# Patient Record
Sex: Female | Born: 1950 | Race: Black or African American | Hispanic: No | Marital: Married | Smoking: Former smoker
Health system: Southern US, Community
[De-identification: ages and names within clinical notes are randomized; demographics above are authoritative.]

## PROBLEM LIST (undated history)

## (undated) ENCOUNTER — Emergency Department (HOSPITAL_BASED_OUTPATIENT_CLINIC_OR_DEPARTMENT_OTHER): Admission: EM | Payer: Medicare Other | Source: Home / Self Care

## (undated) DIAGNOSIS — J449 Chronic obstructive pulmonary disease, unspecified: Secondary | ICD-10-CM

## (undated) DIAGNOSIS — E785 Hyperlipidemia, unspecified: Secondary | ICD-10-CM

## (undated) DIAGNOSIS — J309 Allergic rhinitis, unspecified: Secondary | ICD-10-CM

## (undated) HISTORY — DX: Hyperlipidemia, unspecified: E78.5

## (undated) HISTORY — PX: ABDOMINAL HYSTERECTOMY: SUR658

## (undated) HISTORY — DX: Chronic obstructive pulmonary disease, unspecified: J44.9

## (undated) HISTORY — DX: Allergic rhinitis, unspecified: J30.9

---

## 2009-02-07 ENCOUNTER — Emergency Department (HOSPITAL_BASED_OUTPATIENT_CLINIC_OR_DEPARTMENT_OTHER): Admission: EM | Admit: 2009-02-07 | Discharge: 2009-02-07 | Payer: Self-pay | Admitting: Emergency Medicine

## 2009-02-07 ENCOUNTER — Ambulatory Visit: Payer: Self-pay | Admitting: Diagnostic Radiology
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
## 2022-05-05 ENCOUNTER — Encounter: Payer: Self-pay | Admitting: Pulmonary Disease

## 2022-05-05 ENCOUNTER — Ambulatory Visit: Payer: Medicare HMO | Admitting: Pulmonary Disease

## 2022-05-05 VITALS — BP 120/70 | HR 75 | Ht <= 58 in | Wt 157.6 lb

## 2022-05-05 DIAGNOSIS — R0609 Other forms of dyspnea: Secondary | ICD-10-CM

## 2022-05-05 MED ORDER — BEVESPI AEROSPHERE 9-4.8 MCG/ACT IN AERO: 2.0000 | INHALATION_SPRAY | Freq: Two times a day (BID) | RESPIRATORY_TRACT | 3 refills | Status: DC

## 2022-05-05 NOTE — Patient Instructions (Signed)
Nice to meet you  Continue the Arnuity, continue use the DuoNeb, nebulizer as needed  It may be wise to try DuoNeb or nebulizer treatment prior to resuming exercise  I would like to add a medicine but similar DuoNeb about longer acting to see if that helps throughout the day to help with your symptoms  Use Bevespi 2 puffs twice a day every day.  There is no additional steroid in this, only the Arnuity has a steroid and the inhalers you are taking.  If the shortness of breath is mostly related to kyphoscoliosis, the inhalers may not help much but I think it certainly worth trying since the DuoNebs do seem to help you  Return to clinic in 3 months or sooner as needed with Dr. Silas Flood

## 2022-05-05 NOTE — Progress Notes (Signed)
$@Patienta$  ID: Hailey Odonnell, female    DOB: June 23, 1950, 72 y.o.   MRN: [id number]  Chief Complaint  Patient presents with   Consult    Trouble breathing    Referring provider: Quin Hoop  HPI:   72 y.o. woman who we are seeing for evaluation of dyspnea on exertion.  Most recent PCP note x 2 reviewed.  Most recent pulmonary note reviewed.  Several telephone encounters via pulmonary provider reviewed.  Previously seen by pulmonary in the [facility] in [place].  She notes dyspnea on exertion for some time now.  She notes that it seems prior to [place] she was quite active.  Walking 5 miles a day.  She is much less active now.  As a result she reports weight gain, increased stomach size.  In addition she has known kyphoscoliosis.  She is suspicious that the combination of increased weight as well as her underlying kyphoscoliosis is the main driver of her symptoms.  She had pulmonary function test in 20[facility] showed normal ratio FEV1/FVC, possible mild restriction based on spirometry, I cannot see lung volumes, DLCO was 43% of predicted.  This prompted a CT scan of the chest to evaluate for ILD which on my review shows no evidence of ILD but low lung volumes and significant kyphoscoliosis.  She was been placed on inhalers.  Currently Arnuity.  Helped a little bit.  She is DuoNebs prior to working.  She finds this helps for a few hours.  Short-lived.  For ongoing workup she states she is been seen by cardiologist in [facility].  I cannot review any records or results of test.  But she is reports she had an echocardiogram and a stress test within the last year and told these results are reassuring.  Per pulmonary note she is being diagnosed with an elevated hemidiaphragm as well on imaging.    Questionaires / Pulmonary Flowsheets:   ACT:      No data to display          MMRC:     No data to display          Epworth:      No  data to display          Tests:   FENO:  No results found for: "NITRICOXIDE"  PFT:     No data to display          WALK:      No data to display          Imaging: Personally reviewed via care everywhere No results found.  Lab Results: Personally reviewed via care everywhere CBC No results found for: "WBC", "RBC", "HGB", "HCT", "PLT", "MCV", "MCH", "MCHC", "RDW", "LYMPHSABS", "MONOABS", "EOSABS", "BASOSABS"  BMET No results found for: "NA", "K", "CL", "CO2", "GLUCOSE", "BUN", "CREATININE", "CALCIUM", "GFRNONAA", "GFRAA"  BNP No results found for: "BNP"  ProBNP No results found for: "PROBNP"  Specialty Problems   None   Allergies  Allergen Reactions   Codeine Other (See Comments) and Rash    Shakes and throws up   Oxycodone-Acetaminophen Other (See Comments) and Rash    Gives her the shakes and she throws up   Amoxicillin Rash    Itching    Immunization History  Administered Date(s) Administered   Moderna Sars-Covid-2 Vaccination 04/15/2019, 05/24/2019, 02/27/2020   Unspecified SARS-COV-2 Vaccination 04/15/2019, 05/24/2019, 02/27/2020    History reviewed. No pertinent past medical history.  Tobacco History: Social History   Tobacco Use  Smoking Status Former   Types: Cigarettes  Smokeless Tobacco Never   Counseling given: Not Answered   Continue to not smoke  Outpatient Encounter Medications as of 05/05/2022  Medication Sig   albuterol (VENTOLIN HFA) 108 (90 Base) MCG/ACT inhaler Inhale 1 puff into the lungs every 4 (four) hours as needed.   alendronate (FOSAMAX) 70 MG tablet Take 1 tablet by mouth once a week.   ARNUITY ELLIPTA 100 MCG/ACT AEPB Inhale 1 puff into the lungs daily.   atorvastatin (LIPITOR) 20 MG tablet Take 1 tablet by mouth daily.   citalopram (CELEXA) 20 MG tablet Take 20 mg by mouth daily.   donepezil (ARICEPT) 10 MG tablet Take 10 mg by mouth at bedtime.   fluticasone (FLONASE) 50 MCG/ACT nasal spray Place 1  spray into both nostrils daily.   Glycopyrrolate-Formoterol (BEVESPI AEROSPHERE) 9-4.8 MCG/ACT AERO Inhale 2 puffs into the lungs 2 (two) times daily.   ipratropium-albuterol (DUONEB) 0.5-2.5 (3) MG/3ML SOLN Take 3 mLs by nebulization every 4 (four) hours as needed.   montelukast (SINGULAIR) 10 MG tablet Take 1 tablet by mouth daily.   QVAR REDIHALER 40 MCG/ACT inhaler Inhale 2 puffs into the lungs 2 (two) times daily.   No facility-administered encounter medications on file as of 05/05/2022.     Review of Systems  Review of Systems  Exertion.  No orthopnea or PND.  Comprehensive review of systems otherwise negative. Physical Exam  BP 120/70 (BP Location: Left Arm, Cuff Size: Normal)   Pulse 75   Ht 4' 10"$  (1.473 m)   Wt 157 lb 9.6 oz (71.5 kg)   SpO2 99%   BMI 32.94 kg/m   Wt Readings from Last 5 Encounters:  05/05/22 157 lb 9.6 oz (71.5 kg)    BMI Readings from Last 5 Encounters:  05/05/22 32.94 kg/m     Physical Exam General: Sitting in chair, no acute distress Eyes: EOMI, no icterus Neck: Supple, no JVP Pulmonary: Clear, no work of breathing Abdomen: Nondistended, sounds present MSK: No synovitis, no joint effusion Neuro: Normal gait, no weakness Psych: Normal mood, full affect   Assessment & Plan:   Dyspnea on exertion: Prior spirometry in 2023 without 6 obstruction but did suggest possibility of restriction, I cannot see results of lung volumes.  DLCO is reduced.  However, she has cross-sectional imaging shows no evidence of ILD.  She reports normal stress test and normal echocardiogram via cardiology within the last year.  I cannot review these notes or records.  She has significant kyphoscoliosis on prior imaging.  Suspect she has restriction due to extrathoracic causes, notably this.  Given mild improvement with DuoNebs, recommend Bevespi 2 puffs twice a day.  Continue DuoNebs as needed.  Continue albuterol as needed.  Continue Arnuity, previously prescribed.   It is possible the extrathoracic restriction is the primary driver which medicines will not help much with.  Did encourage her to try to resume slowly increasing exercise tolerance.  This likely will help as well.  She used to be much more active walking 5 miles a day.  Elevated hemidiaphragm: reported on prior imaging.  Suspect related to cervical spine pathology, kyphoscoliosis.  Likely contributor dyspnea on exertion as well as restrictive lung disease.  Do not recommend any further evaluation.   Return in about 3 months (around 08/03/2022).   Lanier Clam, MD 05/05/2022   This appointment required 60 minutes of patient care (this includes precharting, chart review,  review of results, face-to-face care, etc.).

## 2022-05-16 ENCOUNTER — Telehealth: Payer: Self-pay | Admitting: Pulmonary Disease

## 2022-05-16 NOTE — Telephone Encounter (Signed)
Called to speak with a nurse regarding patient's prescription.  Please call to discuss further at [phone]

## 2022-05-16 NOTE — Telephone Encounter (Signed)
Called and spoke to [place]. Nothing further needed

## 2022-07-27 ENCOUNTER — Ambulatory Visit: Payer: Medicare Other | Admitting: Pulmonary Disease

## 2022-07-27 ENCOUNTER — Encounter: Payer: Self-pay | Admitting: Pulmonary Disease

## 2022-07-27 VITALS — BP 102/68 | HR 74 | Ht <= 58 in | Wt 153.2 lb

## 2022-07-27 DIAGNOSIS — R0609 Other forms of dyspnea: Secondary | ICD-10-CM

## 2022-07-27 MED ORDER — PREDNISONE 20 MG PO TABS: 20.0000 mg | ORAL_TABLET | Freq: Every day | ORAL | 0 refills | Status: AC

## 2022-07-27 NOTE — Progress Notes (Signed)
@Patient  ID: Hailey Odonnell, female    DOB: Dec 14, 1950, 72 y.o.   MRN: [id number]  Chief Complaint  Patient presents with   Follow-up    SOB  DOE    Referring provider: Mathis Fare  HPI:   72 y.o. woman who we are seeing for evaluation of dyspnea on exertion.   Returns for follow-up.  Improved with DuoNebs in the past.  Some concern for possible reaction versus asthma.  Prescribed Bevespi last visit.  Seem to help a little bit.  But not as much recently.  Worse in the last 2 weeks.  Notes the pollen has made her eyes and nose allergies worse as well.  She is using Arnuity nearly as needed, not regularly.  Increasing frequency of DuoNebs last 3 days.  HPI at initial visit: She notes dyspnea on exertion for some time now.  She notes that it seems prior to [place] she was quite active.  Walking 5 miles a day.  She is much less active now.  As a result she reports weight gain, increased stomach size.  In addition she has known kyphoscoliosis.  She is suspicious that the combination of increased weight as well as her underlying kyphoscoliosis is the main driver of her symptoms.  She had pulmonary function test in [facility] showed normal ratio FEV1/FVC, possible mild restriction based on spirometry, I cannot see lung volumes, DLCO was 43% of predicted.  This prompted a CT scan of the chest to evaluate for ILD which on my review shows no evidence of ILD but low lung volumes and significant kyphoscoliosis.  She was been placed on inhalers.  Currently Arnuity.  Helped a little bit.  She is DuoNebs prior to working.  She finds this helps for a few hours.  Short-lived.  For ongoing workup she states she is been seen by cardiologist in [facility].  I cannot review any records or results of test.  But she is reports she had an echocardiogram and a stress test within the last year and told these results are reassuring.  Per pulmonary note she is being diagnosed with an  elevated hemidiaphragm as well on imaging.    Questionaires / Pulmonary Flowsheets:   ACT:      No data to display          MMRC:     No data to display          Epworth:      No data to display          Tests:   FENO:  No results found for: "NITRICOXIDE"  PFT:     No data to display          WALK:      No data to display          Imaging: Personally reviewed via care everywhere No results found.  Lab Results: Personally reviewed via care everywhere CBC No results found for: "WBC", "RBC", "HGB", "HCT", "PLT", "MCV", "MCH", "MCHC", "RDW", "LYMPHSABS", "MONOABS", "EOSABS", "BASOSABS"  BMET No results found for: "NA", "K", "CL", "CO2", "GLUCOSE", "BUN", "CREATININE", "CALCIUM", "GFRNONAA", "GFRAA"  BNP No results found for: "BNP"  ProBNP No results found for: "PROBNP"  Specialty Problems   None   Allergies  Allergen Reactions   Codeine Other (See Comments) and Rash    Shakes and throws up   Other Other (See Comments)   Oxycodone-Acetaminophen Other (See Comments), Rash and Anaphylaxis  Gives her the shakes and she throws up   Amoxicillin Rash    Itching   Levofloxacin Other (See Comments)   Memantine Other (See Comments)    unknown    Immunization History  Administered Date(s) Administered   Influenza, High Dose Seasonal PF 12/20/2016, 12/07/2018, 02/04/2020, 12/01/2020   Influenza,inj,Quad PF,6+ Mos 01/05/2016   Influenza-Unspecified 12/20/2016   Moderna Sars-Covid-2 Vaccination 04/15/2019, 05/24/2019, 02/27/2020   PNEUMOCOCCAL CONJUGATE-20 05/26/2022   PPD Test 09/13/2018, 09/13/2018, 09/03/2019, 09/03/2019, 04/18/2021, 04/18/2021   Unspecified SARS-COV-2 Vaccination 04/15/2019, 05/24/2019, 02/27/2020    Past Medical History:  Diagnosis Date   Allergic rhinitis    COPD (chronic obstructive pulmonary disease) (HCC)    Hyperlipidemia     Tobacco History: Social History   Tobacco Use  Smoking Status Former    Types: Cigarettes  Smokeless Tobacco Never   Counseling given: Not Answered   Continue to not smoke  Outpatient Encounter Medications as of 07/27/2022  Medication Sig   albuterol (VENTOLIN HFA) 108 (90 Base) MCG/ACT inhaler Inhale 1 puff into the lungs every 4 (four) hours as needed.   alendronate (FOSAMAX) 70 MG tablet Take 1 tablet by mouth once a week.   ARNUITY ELLIPTA 100 MCG/ACT AEPB Inhale 1 puff into the lungs daily.   atorvastatin (LIPITOR) 20 MG tablet Take 1 tablet by mouth daily.   citalopram (CELEXA) 20 MG tablet Take 20 mg by mouth daily.   donepezil (ARICEPT) 10 MG tablet Take 10 mg by mouth at bedtime.   fluticasone (FLONASE) 50 MCG/ACT nasal spray Place 1 spray into both nostrils daily.   Glycopyrrolate-Formoterol (BEVESPI AEROSPHERE) 9-4.8 MCG/ACT AERO Inhale 2 puffs into the lungs 2 (two) times daily.   ipratropium-albuterol (DUONEB) 0.5-2.5 (3) MG/3ML SOLN Take 3 mLs by nebulization every 4 (four) hours as needed.   montelukast (SINGULAIR) 10 MG tablet Take 1 tablet by mouth daily.   predniSONE (DELTASONE) 20 MG tablet Take 1 tablet (20 mg total) by mouth daily with breakfast for 5 days.   No facility-administered encounter medications on file as of 07/27/2022.     Review of Systems  Review of Systems  N/a Physical Exam  BP 102/68 (BP Location: Left Arm, Patient Position: Sitting, Cuff Size: Normal)   Pulse 74   Ht 4\' 9"  (1.448 m)   Wt 153 lb 3.2 oz (69.5 kg)   SpO2 100%   BMI 33.15 kg/m   Wt Readings from Last 5 Encounters:  07/27/22 153 lb 3.2 oz (69.5 kg)  05/05/22 157 lb 9.6 oz (71.5 kg)    BMI Readings from Last 5 Encounters:  07/27/22 33.15 kg/m  05/05/22 32.94 kg/m     Physical Exam General: Sitting in chair, no acute distress Eyes: EOMI, no icterus Neck: Supple, no JVP Pulmonary: Clear, normal work of breathing Abdomen: Nondistended, sounds present MSK: No synovitis, no joint effusion Neuro: Normal gait, no weakness Psych: Normal  mood, full affect   Assessment & Plan:   Dyspnea on exertion: Prior spirometry in 2023 without 6 obstruction but did suggest possibility of restriction, I cannot see results of lung volumes.  DLCO is reduced.  However, she has cross-sectional imaging shows no evidence of ILD.  She reports normal stress test and normal echocardiogram via cardiology within the last year.  I cannot review these notes or records.  She has significant kyphoscoliosis on prior imaging.  Suspect she has restriction due to extrathoracic causes, notably this.  Seems improved with DuoNebs.  Started as the last visit, cough helpful  as been worsening symptoms the last 2 weeks or so, consider concern for asthma flare.  No wheezing on exam, lungs clear overall, but she is tachypneic.  Notably sats are 100%.  Prednisone 20 mg for 5 days sent, hopefully will be beneficial episode may need to resume ICS, previously on Arnuity.  Elevated hemidiaphragm: reported on prior imaging.  Suspect related to cervical spine pathology, kyphoscoliosis.  Likely contributor dyspnea on exertion as well as restrictive lung disease.  Do not recommend any further evaluation.   Return in about 2 months (around 09/26/2022).   Karren Burly, MD 07/27/2022

## 2022-07-27 NOTE — Patient Instructions (Signed)
Nice to see you again  Sorry have not been feeling well the last few weeks  Take prednisone 20 mg once a day for 5 days then stop  Continue Bevespi twice a day, continue nebulizers and albuterol as needed, will hold that with the prednisone in the next day or 2 we will start to wean these medications less frequently.  Take the prednisone helped quite a bit, we may need to resume the inhaler in the future  Return to clinic in 2 months or sooner as needed with Dr. Judeth Horn

## 2022-09-04 ENCOUNTER — Other Ambulatory Visit: Payer: Self-pay | Admitting: Pulmonary Disease

## 2022-09-25 ENCOUNTER — Telehealth: Payer: Self-pay | Admitting: Pulmonary Disease

## 2022-09-25 MED ORDER — PREDNISONE 10 MG PO TABS: 40.0000 mg | ORAL_TABLET | Freq: Every day | ORAL | 0 refills | Status: DC

## 2022-09-25 NOTE — Telephone Encounter (Signed)
Attempted to call patient several times, the line does not ring, there is a long pause and then it goes to a fast busy signal.  Prednisone sent into patient's pharmacy.

## 2022-09-25 NOTE — Telephone Encounter (Signed)
PT calling for Dr. Derwood Kaplan nurse. SOB and had an episode last Wed. No appt's avail. Please call @ [phone]

## 2022-09-25 NOTE — Telephone Encounter (Signed)
Please send in prednisone 40 mg a day for 5 days

## 2022-09-25 NOTE — Telephone Encounter (Signed)
Primary Pulmonologist: [place] Last office visit and with whom: 07/27/2022 What do we see them for (pulmonary problems): DOE, reactive airway disease Last OV assessment/plan:     Was appointment offered to patient (explain)?  No acute's available.  Scheduled for f/u on 8/8 with MH.   Reason for call:  Micah Flesher to [place] on Tuesday.  She went out on Wednesday, Wednesday evening she used her nebulizer, she was ok.  About 8 pm she was sob, trembling, confused.  Her family gave her another nebulizer treatment.  She is using the duoneb every 4 hours.  She said it took a lot out of her.  She was so weak she could not go to a cookout.  She is better now, but she is sob when she talks. She said he sinuses are draining as well.   Advised that Dr. Judeth Horn is not available, however will send this to our doctor of the day and once we hear back, we will call her back with his recommendations.  She verbalized understanding.  Dr. Isaiah Serge, please advise.  Thank you.  (examples of things to ask: : When did symptoms start? Fever? Cough? Productive? Color to sputum? More sputum than usual? Wheezing? Have you needed increased oxygen? Are you taking your respiratory medications? What over the counter measures have you tried?)  Allergies  Allergen Reactions   Codeine Other (See Comments) and Rash    Shakes and throws up   Other Other (See Comments)   Oxycodone-Acetaminophen Other (See Comments), Rash and Anaphylaxis    Gives her the shakes and she throws up   Amoxicillin Rash    Itching   Levofloxacin Other (See Comments)   Memantine Other (See Comments)    unknown    Immunization History  Administered Date(s) Administered   Influenza, High Dose Seasonal PF 12/20/2016, 12/07/2018, 02/04/2020, 12/01/2020   Influenza,inj,Quad PF,6+ Mos 01/05/2016   Influenza-Unspecified 12/20/2016   Moderna Sars-Covid-2 Vaccination 04/15/2019, 05/24/2019, 02/27/2020   PNEUMOCOCCAL CONJUGATE-20 05/26/2022   PPD Test  09/13/2018, 09/13/2018, 09/03/2019, 09/03/2019, 04/18/2021, 04/18/2021   Unspecified SARS-COV-2 Vaccination 04/15/2019, 05/24/2019, 02/27/2020

## 2022-09-26 NOTE — Telephone Encounter (Signed)
Called and spoke with patient, advised that Prednisone was sent into her pharmacy and she should take 40 mg daily for 5 days and she should take it with food.   I let her know that if she is not feeling better after she complete the prednisone to call us back and let us know and we may need to get her in the office for a visit.  She did state that she is feeling better this morning.  She verbalized understanding.

## 2022-10-26 ENCOUNTER — Ambulatory Visit: Payer: Medicare Other | Admitting: Pulmonary Disease

## 2022-11-10 ENCOUNTER — Telehealth: Payer: Self-pay | Admitting: Pulmonary Disease

## 2022-11-10 NOTE — Telephone Encounter (Signed)
Pt is having sinus problems really bad

## 2022-11-17 NOTE — Telephone Encounter (Signed)
Attempted to contact patient no answer or voicemail.  Patient has upcoming appointment but can see PCP or urgent care if needs to be seen sooner.

## 2022-11-28 ENCOUNTER — Encounter: Payer: Self-pay | Admitting: Nurse Practitioner

## 2022-11-28 ENCOUNTER — Ambulatory Visit: Payer: Medicare Other | Admitting: Nurse Practitioner

## 2022-11-28 ENCOUNTER — Ambulatory Visit (INDEPENDENT_AMBULATORY_CARE_PROVIDER_SITE_OTHER): Payer: Medicare Other

## 2022-11-28 VITALS — BP 118/62 | HR 73 | Temp 97.1°F | Ht <= 58 in | Wt 158.8 lb

## 2022-11-28 DIAGNOSIS — R0609 Other forms of dyspnea: Secondary | ICD-10-CM | POA: Diagnosis not present

## 2022-11-28 DIAGNOSIS — J019 Acute sinusitis, unspecified: Secondary | ICD-10-CM | POA: Diagnosis not present

## 2022-11-28 DIAGNOSIS — J984 Other disorders of lung: Secondary | ICD-10-CM | POA: Insufficient documentation

## 2022-11-28 DIAGNOSIS — J4541 Moderate persistent asthma with (acute) exacerbation: Secondary | ICD-10-CM

## 2022-11-28 DIAGNOSIS — J4540 Moderate persistent asthma, uncomplicated: Secondary | ICD-10-CM | POA: Insufficient documentation

## 2022-11-28 LAB — CBC WITH DIFFERENTIAL/PLATELET
Basophils Absolute: 0 10*3/uL (ref 0.0–0.1)
Basophils Relative: 0.6 % (ref 0.0–3.0)
Eosinophils Absolute: 0 10*3/uL (ref 0.0–0.7)
Eosinophils Relative: 0.6 % (ref 0.0–5.0)
HCT: 43.1 % (ref 36.0–46.0)
Hemoglobin: 14 g/dL (ref 12.0–15.0)
Lymphocytes Relative: 43.6 % (ref 12.0–46.0)
Lymphs Abs: 2.6 10*3/uL (ref 0.7–4.0)
MCHC: 32.6 g/dL (ref 30.0–36.0)
MCV: 94.2 fl (ref 78.0–100.0)
Monocytes Absolute: 0.8 10*3/uL (ref 0.1–1.0)
Monocytes Relative: 12.7 % — ABNORMAL HIGH (ref 3.0–12.0)
Neutro Abs: 2.5 10*3/uL (ref 1.4–7.7)
Neutrophils Relative %: 42.5 % — ABNORMAL LOW (ref 43.0–77.0)
Platelets: 275 10*3/uL (ref 150.0–400.0)
RBC: 4.57 Mil/uL (ref 3.87–5.11)
RDW: 14.2 % (ref 11.5–15.5)
WBC: 5.9 10*3/uL (ref 4.0–10.5)

## 2022-11-28 LAB — BASIC METABOLIC PANEL
BUN: 12 mg/dL (ref 6–23)
CO2: 31 meq/L (ref 19–32)
Calcium: 9.8 mg/dL (ref 8.4–10.5)
Chloride: 104 meq/L (ref 96–112)
Creatinine, Ser: 0.91 mg/dL (ref 0.40–1.20)
GFR: 62.37 mL/min (ref >60.00)
Glucose, Bld: 73 mg/dL (ref 70–99)
Potassium: 3.8 meq/L (ref 3.5–5.1)
Sodium: 141 meq/L (ref 135–145)

## 2022-11-28 LAB — BRAIN NATRIURETIC PEPTIDE: Pro B Natriuretic peptide (BNP): 41 pg/mL (ref 0.0–100.0)

## 2022-11-28 MED ORDER — PREDNISONE 10 MG PO TABS
ORAL_TABLET | ORAL | 0 refills | Status: DC
Start: 2022-11-28 — End: 2023-05-17

## 2022-11-28 MED ORDER — DOXYCYCLINE HYCLATE 100 MG PO TABS
100.0000 mg | ORAL_TABLET | Freq: Two times a day (BID) | ORAL | 0 refills | Status: AC
Start: 2022-11-28 — End: ?

## 2022-11-28 MED ORDER — AZELASTINE HCL 0.1 % NA SOLN
2.0000 | Freq: Two times a day (BID) | NASAL | 5 refills | Status: AC
Start: 2022-11-28 — End: ?

## 2022-11-28 NOTE — Patient Instructions (Addendum)
Continue Albuterol inhaler 2 puffs or 3 mL neb every 6 hours as needed for shortness of breath or wheezing. Notify if symptoms persist despite rescue inhaler/neb use.  Continue Bevespi 2 puffs Twice daily  Continue Arnuity 1 puff daily. Brush tongue and rinse mouth afterwards Continue singulair 1 tab At bedtime  Continue flonase nasal spray 2 sprays each nostril daily Continue mucinex over the counter  Continue over the counter allergy pill  Start saline nasal rinses 1-2 times a day and follow with flonase nasal spray 20-30 minutes later Astelin nasal spray 2 sprays each nostril Twice daily. Use 20-30 minutes after the saline rinse  Doxycycline 1 tab Twice daily for 7 days. Take with food. Wear sunscreen when you are outside  Prednisone taper. 4 tabs for 2 days, then 3 tabs for 2 days, 2 tabs for 2 days, then 1 tab for 2 days, then stop. Take in AM with food.   Labs today   Chest x ray today   Follow up in 2-3 weeks with Dr. Judeth Horn. If symptoms do not improve or worsen, please contact office for sooner follow up or seek emergency care.

## 2022-11-28 NOTE — Assessment & Plan Note (Addendum)
Possible asthma exacerbation. Very mild end expiratory bronchospasm on exam. She did have prior improvement with steroid course. Will treat her with prednisone taper and target sinus symptoms. She had intermittent increased work of breathing that improved when distracted in office. Question a component of anxiety contributing to symptoms as well? Will re-evaluate response to treatment at follow up and determine if further workup indicated. Action plan in place. Close follow up. Strict return precautions reviewed.

## 2022-11-28 NOTE — Assessment & Plan Note (Signed)
See above. CXR today to rule out superimposed infection or volume overload given weight gain. Check BMET, BNP, CBC with diff to rule out cardiac etiology or anemia as well as assess eosinophil count.

## 2022-11-28 NOTE — Progress Notes (Signed)
@Patient  ID: Hailey Odonnell, female    DOB: 03/26/1950, 72 y.o.   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 She is a patient of Dr. Laurena Spies and last seen in office 07/27/2022. Past medical history significant for dementia, HLD, allergies, dementia, anxiety.   TEST/EVENTS:  04/13/2021 echo: EF 60-65%, RV size and function nl. Trace TR.  07/01/2021 CT chest wo contrast: atherosclerosis. Thickening of distal esophageal wall. No LAD. Linear opacities in right base, scarring and mild atelectasis. Minimal atelectasis in left base. Markedly exaggerated kyphosis   07/27/2022: OV with Dr. Judeth Horn. DOE. Concern for possible asthma. Has improved with duonebs in the past. Started on [place] last visit. Seem to help a little bit. Worse in last 2 weeks. Notes pollen has made eyes and nose allergies worse as well. Using Arnuity as needed. Increased frequency of duonebs over last 3 days. Spirometry from 2023 without obstruction; possible restriction. DLCO reduced. Imaging without evidence of ILD. Previous normal stress test and echo. Significant kyphoscoliosis on prior imaging. Also has elevated hemidiaphragm. Suspect she has restriction d/t extrathoracic causes. Consider asthma flare. Treat with prednisone x 5 days.   11/28/2022: Today - follow up Patient presents today for overdue follow up. She has been having more trouble with her breathing since she went to visit her daughter in [place]. She tells me she gets episodes where she feels like she can't catch her breath. She tends to feel weak during these. They happen at random. She doesn't notice any identifiable triggers. She does use her nebs, which help, but it does take her a little bit to recover even with this.  Notices an occasional wheeze. She is having more trouble with her sinuses. Feels like they're constantly draining. Occasionally feels like she has to clear her throat with a cough but otherwise no trouble with frequent coughing spells. She's had a 5 pound weight gain since she was here last. Denies fevers, chills, hemoptysis, leg swelling, orthopnea, palpitations, CP, syncope. She is using Bevespi and Arnuity. Feels the Bevespi helps the most. She did do a short course of steroids in July when symptoms started and felt like this helped but then symptoms returned. She rotates between generic zyrtec and claritin. She takes singulair nightly. She uses flonase nasal spray. Doesn't do rinses regularly. She's using her nebs 1-2 times a day.   Allergies  Allergen Reactions   Codeine Other (See Comments) and Rash    Shakes and throws up   Other Other (See Comments)   Oxycodone-Acetaminophen Other (See Comments), Rash and Anaphylaxis    Gives her the shakes and she throws up   Amoxicillin Rash    Itching   Levofloxacin Other (See Comments)   Memantine Other (See Comments)    unknown    Immunization History  Administered Date(s) Administered   Influenza, High Dose Seasonal PF 12/20/2016, 12/07/2018, 02/04/2020, 12/01/2020   Influenza,inj,Quad PF,6+ Mos 01/05/2016   Influenza-Unspecified 12/20/2016   PNEUMOCOCCAL CONJUGATE-20 05/26/2022   PPD Test 09/13/2018, 09/13/2018, 09/03/2019, 09/03/2019, 04/18/2021, 04/18/2021   Unspecified SARS-COV-2 Vaccination 04/15/2019, 05/24/2019, 02/27/2020    Past Medical History:  Diagnosis Date   Allergic rhinitis    COPD (chronic obstructive pulmonary disease) (HCC)    Hyperlipidemia     Tobacco  History: Social History   Tobacco Use  Smoking Status Former   Types: Cigarettes  Smokeless Tobacco Never   Counseling given: Not Answered   Outpatient Medications Prior to Visit  Medication Sig Dispense Refill   albuterol (VENTOLIN HFA) 108 (90 Base)  MCG/ACT inhaler Inhale 1 puff into the lungs every 4 (four) hours as needed.     alendronate (FOSAMAX) 70 MG tablet Take 1 tablet by mouth once a week.     ARNUITY ELLIPTA 100 MCG/ACT AEPB Inhale 1 puff into the lungs daily.     atorvastatin (LIPITOR) 20 MG tablet Take 1 tablet by mouth daily.     citalopram (CELEXA) 20 MG tablet Take 20 mg by mouth daily.     donepezil (ARICEPT) 10 MG tablet Take 10 mg by mouth at bedtime.     fluticasone (FLONASE) 50 MCG/ACT nasal spray Place 1 spray into both nostrils daily.     Glycopyrrolate-Formoterol (BEVESPI AEROSPHERE) 9-4.8 MCG/ACT AERO Inhale 2 puffs by mouth twice daily 11 g 3   ipratropium-albuterol (DUONEB) 0.5-2.5 (3) MG/3ML SOLN Take 3 mLs by nebulization every 4 (four) hours as needed.     montelukast (SINGULAIR) 10 MG tablet Take 1 tablet by mouth daily.     predniSONE (DELTASONE) 10 MG tablet Take 4 tablets (40 mg total) by mouth daily with breakfast. (Patient not taking: Reported on 11/28/2022) 20 tablet 0   No facility-administered medications prior to visit.     Review of Systems:   Constitutional: No night sweats, fevers, chills, fatigue, or lassitude. +weight gain  HEENT: No headaches, difficulty swallowing, tooth/dental problems, or sore throat. No sneezing, itching, ear ache. +nasal congestion, post nasal drip, throat clearing CV:  No chest pain, orthopnea, PND, swelling in lower extremities, anasarca, dizziness, palpitations, syncope Resp: +shortness of breath with exertion; wheeze; rare cough with clear phlegm. No excess mucus or change in color of mucus. No hemoptysis. No chest wall deformity GI:  No heartburn, indigestion, abdominal pain, nausea, vomiting, diarrhea, change in bowel habits, loss of appetite, bloody stools.  GU: No dysuria, change in color of urine, urgency or frequency.   Skin: No rash, lesions, ulcerations MSK:  No joint pain or swelling.   Neuro: No dizziness or lightheadedness.  Psych: No depression or  anxiety. Mood stable.     Physical Exam:  BP 118/62 (BP Location: Left Arm, Patient Position: Sitting, Cuff Size: Normal)   Pulse 73   Temp (!) 97.1 F (36.2 C) (Temporal)   Ht 4\' 9"  (1.448 m)   Wt 158 lb 12.8 oz (72 kg)   SpO2 99%   BMI 34.36 kg/m   GEN: Pleasant, interactive, well-kempt; obese; short stature; in no acute distress. HEENT:  Normocephalic and atraumatic. EACs patent bilaterally. TM pearly gray with present light reflex bilaterally. PERRLA. Sclera white. Nasal turbinates erythematous, moist and patent bilaterally. Clear rhinorrhea present. Oropharynx erythematous and moist, without exudate or edema. No lesions, ulcerations NECK:  Supple w/ fair ROM. No JVD present. Normal carotid impulses w/o bruits. Thyroid symmetrical with no goiter or nodules palpated. No lymphadenopathy.   CV: RRR, no m/r/g, no peripheral edema. Pulses intact, +2 bilaterally. No cyanosis, pallor or clubbing. PULMONARY:  Intermittent increased work of breathing at rest. Able to speak in complete sentences without difficulty. End expiratory wheeze bilaterally A&P. No accessory muscle use.  GI: BS present and normoactive. Soft, non-tender to palpation. No organomegaly or masses detected.  MSK: No erythema, warmth or tenderness. Cap refil <2 sec all extrem. No  deformities or joint swelling noted.  Neuro: A/Ox3. No focal deficits noted.   Skin: Warm, no lesions or rashe Psych: Normal affect and behavior. Judgement and thought content appropriate.     Lab Results:  CBC No results found for: "WBC", "RBC", "HGB", "HCT", "PLT", "MCV", "MCH", "MCHC", "RDW", "LYMPHSABS", "MONOABS", "EOSABS", "BASOSABS"  BMET No results found for: "NA", "K", "CL", "CO2", "GLUCOSE", "BUN", "CREATININE", "CALCIUM", "GFRNONAA", "GFRAA"  BNP No results found for: "BNP"   Imaging:  DG Chest 2 View  Result Date: 11/28/2022 CLINICAL DATA:  increased SOB EXAM: CHEST - 2 VIEW COMPARISON:  None Available. FINDINGS: No  pleural effusion. No pneumothorax. Borderline cardiomegaly. No focal airspace opacity. No radiographically apparent displaced rib fractures. Visualized upper abdomen is unremarkable. Vertebral body heights are maintained. There is exaggerated thoracic kyphosis and lumbar lordosis. 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 IMPRESSION: No focal airspace opacity Electronically Signed   By: Lorenza Cambridge M.D.   On: 11/28/2022 12:20    Administration History     None           No data to display          No results found for: "NITRICOXIDE"      Assessment & Plan:   Acute sinusitis Worsening symptoms. Question if she initially had some sort of viral illness with symptoms that failed to improve and now flaring again with onset of allergy season? Suspect this is playing a role in her dyspnea. We will treat her with empiric doxycycline x 7 days given timeframe and failure to improve with conservative treatment. Add on saline rinses and intranasal antihistamine. Continue flonase, rotating antihistamine, and singulair. Need to check allergen panel at follow up.   Patient Instructions  Continue Albuterol inhaler 2 puffs or 3 mL neb every 6 hours as needed for shortness of breath or wheezing. Notify if symptoms persist despite rescue inhaler/neb use.  Continue Bevespi 2 puffs Twice daily  Continue Arnuity 1 puff daily. Brush tongue and rinse mouth afterwards Continue singulair 1 tab At bedtime  Continue flonase nasal spray 2 sprays each nostril daily Continue mucinex over the counter  Continue over the counter allergy pill  Start saline nasal rinses 1-2 times a day and follow with flonase nasal spray 20-30 minutes later Astelin nasal spray 2 sprays each nostril Twice daily. Use 20-30 minutes after the saline rinse  Doxycycline 1 tab Twice daily for 7 days. Take with food. Wear sunscreen when you are outside  Prednisone taper. 4 tabs for 2 days, then 3 tabs for 2 days,  2 tabs for 2 days, then 1 tab for 2 days, then stop. Take in AM with food.   Labs today   Chest x ray today   Follow up in 2-3 weeks with Dr. Judeth Horn. If symptoms do not improve or worsen, please contact office for sooner follow up or seek emergency care.    Moderate persistent asthma Possible asthma exacerbation. Very mild end expiratory bronchospasm on exam. She did have prior improvement with steroid course. Will treat her with prednisone taper and target sinus symptoms. She had intermittent increased work of breathing that improved when distracted in office. Question a component of anxiety contributing to symptoms as well? Will re-evaluate response to treatment at follow up and determine if further workup indicated. Action plan in place. Close follow up. Strict return precautions reviewed.   DOE (dyspnea on exertion) See above. CXR today to rule out superimposed infection or volume overload given weight gain.  Check BMET, BNP, CBC with diff to rule out cardiac etiology or anemia as well as assess eosinophil count.   Restrictive lung disease In setting of kyphoscoliosis and elevated hemidiaphragm. See above. I was unable to locate previous PFTs but they were reviewed at her visit with Dr. Judeth Horn.    I spent 45 minutes of dedicated to the care of this patient on the date of this encounter to include pre-visit review of records, face-to-face time with the patient discussing conditions above, post visit ordering of testing, clinical documentation with the electronic health record, making appropriate referrals as documented, and communicating necessary findings to members of the patients care team.  Noemi Chapel, NP 11/28/2022  Pt aware and understands NP's role.

## 2022-11-28 NOTE — Assessment & Plan Note (Addendum)
In setting of kyphoscoliosis and elevated hemidiaphragm. See above. I was unable to locate previous PFTs but they were reviewed at her visit with Dr. Judeth Horn.

## 2022-11-28 NOTE — Assessment & Plan Note (Signed)
Worsening symptoms. Question if she initially had some sort of viral illness with symptoms that failed to improve and now flaring again with onset of allergy season? Suspect this is playing a role in her dyspnea. We will treat her with empiric doxycycline x 7 days given timeframe and failure to improve with conservative treatment. Add on saline rinses and intranasal antihistamine. Continue flonase, rotating antihistamine, and singulair. Need to check allergen panel at follow up.   Patient Instructions  Continue Albuterol inhaler 2 puffs or 3 mL neb every 6 hours as needed for shortness of breath or wheezing. Notify if symptoms persist despite rescue inhaler/neb use.  Continue Bevespi 2 puffs Twice daily  Continue Arnuity 1 puff daily. Brush tongue and rinse mouth afterwards Continue singulair 1 tab At bedtime  Continue flonase nasal spray 2 sprays each nostril daily Continue mucinex over the counter  Continue over the counter allergy pill  Start saline nasal rinses 1-2 times a day and follow with flonase nasal spray 20-30 minutes later Astelin nasal spray 2 sprays each nostril Twice daily. Use 20-30 minutes after the saline rinse  Doxycycline 1 tab Twice daily for 7 days. Take with food. Wear sunscreen when you are outside  Prednisone taper. 4 tabs for 2 days, then 3 tabs for 2 days, 2 tabs for 2 days, then 1 tab for 2 days, then stop. Take in AM with food.   Labs today   Chest x ray today   Follow up in 2-3 weeks with Dr. Judeth Horn. If symptoms do not improve or worsen, please contact office for sooner follow up or seek emergency care.

## 2022-11-29 ENCOUNTER — Other Ambulatory Visit: Payer: Self-pay | Admitting: Nurse Practitioner

## 2022-11-29 DIAGNOSIS — I517 Cardiomegaly: Secondary | ICD-10-CM

## 2022-11-29 DIAGNOSIS — R0609 Other forms of dyspnea: Secondary | ICD-10-CM

## 2022-11-29 NOTE — Progress Notes (Signed)
Heart mildly enlarged on chest x ray. Recommend echocardiogram for further evaluation. Orders placed. There's no acute process in the lungs. Thanks.

## 2022-11-29 NOTE — Progress Notes (Signed)
MyChart unavailable. Labs were unremarkable. Thanks.

## 2022-12-21 ENCOUNTER — Ambulatory Visit: Payer: Medicare Other | Admitting: Pulmonary Disease

## 2023-01-12 ENCOUNTER — Telehealth: Payer: Self-pay | Admitting: Pulmonary Disease

## 2023-01-12 NOTE — Telephone Encounter (Signed)
Patient is returning missed call. May have been in reference to echocardiogram order.

## 2023-01-17 NOTE — Telephone Encounter (Signed)
Atc patient back to give her Echo appt info but vm is full

## 2023-01-17 NOTE — Telephone Encounter (Signed)
Pt was called for cxr results on 10/25 from Me. Did PCC's call pt for echo scheduling ?

## 2023-01-22 NOTE — Telephone Encounter (Signed)
Spoke to the patient and gave appt info

## 2023-01-29 ENCOUNTER — Encounter: Payer: Self-pay | Admitting: Pulmonary Disease

## 2023-01-29 ENCOUNTER — Telehealth: Payer: Self-pay | Admitting: Pulmonary Disease

## 2023-01-29 ENCOUNTER — Ambulatory Visit (INDEPENDENT_AMBULATORY_CARE_PROVIDER_SITE_OTHER): Payer: Medicare Other | Admitting: Pulmonary Disease

## 2023-01-29 VITALS — BP 110/60 | HR 81 | Temp 98.3°F | Ht <= 58 in | Wt 156.8 lb

## 2023-01-29 DIAGNOSIS — J4551 Severe persistent asthma with (acute) exacerbation: Secondary | ICD-10-CM | POA: Diagnosis not present

## 2023-01-29 MED ORDER — METHYLPREDNISOLONE ACETATE 80 MG/ML IJ SUSP
80.0000 mg | Freq: Once | INTRAMUSCULAR | Status: AC
  Administered 2023-01-29: 80 mg via INTRAMUSCULAR

## 2023-01-29 NOTE — Patient Instructions (Addendum)
Nice to see you again  Solu-Medrol or steroid shot today  Start taking prednisone 40 mg a day tomorrow, for 4 total days  Continue all your inhalers and DuoNebs as prescribed  Dr. Maryfrances Bunnell is the ENT doctor who was working on the vocal cord issue  Dr. Melina Schools was the allergy doctor  I think it is worth contacting both of their offices and getting back in for further evaluation  Follow-up in 4 to 6 weeks

## 2023-01-29 NOTE — Telephone Encounter (Signed)
Pt needs to cancel her echo due to insurance purposes

## 2023-01-29 NOTE — Progress Notes (Signed)
@Patient  ID: Hailey Odonnell, female    DOB: 1950/12/04, 72 y.o.   MRN: [id number]  Chief Complaint  Patient presents with   Acute Visit    SOB, fatigue and weakness x 2 years.  Seen in ED 01/27/2023 for SOB episode.    Referring provider: Drenda Freeze  HPI:   72 y.o. woman who we are seeing for evaluation of dyspnea on exertion.  Most recent ED note reviewed.  Multiple ENT notes reviewed.  Allergy and immunology note reviewed.  Prior pulmonary notes from [facility] reviewed.  Feeling worse over the last several days.  Went to ED 2 days ago.  CTA and chest x-ray clear.  Labs okay.  No wheezing.  Presumed asthma exacerbation.  Given a dose of oral steroids and DuoNeb.  Felt a bit better.  Prescribed oral steroids and DuoNebs.  She is yet to pick up either of these.  Has not had any treatment in 48 hours.  Not feeling better.  On exam she is clear.  Reviewed with her CT scan and chest x-ray no pneumonia, unlikely to have heart failure etc.  Echocardiogram ordered in the past to him in the coming weeks.  Reviewed prior ENT notes with laryngospasm.  Laryngeal spasm.  Some vocal cord issues.  Do suspect her wonder if this is contributing.  Discussed revisiting with allergy as well given seasonal variations to be symptoms.  Seems like most likely this is asthma.  But we need to think of these other things that it would not get better with her normal asthma treatment.  HPI at initial visit: She notes dyspnea on exertion for some time now.  She notes that it seems prior to COVID she was quite active.  Walking 5 miles a day.  She is much less active now.  As a result she reports weight gain, increased stomach size.  In addition she has known kyphoscoliosis.  She is suspicious that the combination of increased weight as well as her underlying kyphoscoliosis is the main driver of her symptoms.  She had pulmonary function test in 2023 via [facility] and [facility] showed normal ratio  FEV1/FVC, possible mild restriction based on spirometry, I cannot see lung volumes, DLCO was 43% of predicted.  This prompted a CT scan of the chest to evaluate for ILD which on my review shows no evidence of ILD but low lung volumes and significant kyphoscoliosis.  She was been placed on inhalers.  Currently Arnuity.  Helped a little bit.  She is DuoNebs prior to working.  She finds this helps for a few hours.  Short-lived.  For ongoing workup she states she is been seen by cardiologist in [facility].  I cannot review any records or results of test.  But she is reports she had an echocardiogram and a stress test within the last year and told these results are reassuring.  Per pulmonary note she is being diagnosed with an elevated hemidiaphragm as well on imaging.    Questionaires / Pulmonary Flowsheets:   ACT:      No data to display          MMRC:     No data to display          Epworth:      No data to display          Tests:   FENO:  No results found for: "NITRICOXIDE"  PFT:     No data to display  WALK:      No data to display          Imaging: Personally reviewed via care everywhere No results found.  Lab Results: Personally reviewed via care everywhere CBC    Component Value Date/Time   WBC 5.9 11/28/2022 1050   RBC 4.57 11/28/2022 1050   HGB 14.0 11/28/2022 1050   HCT 43.1 11/28/2022 1050   PLT 275.0 11/28/2022 1050   MCV 94.2 11/28/2022 1050   MCHC 32.6 11/28/2022 1050   RDW 14.2 11/28/2022 1050   LYMPHSABS 2.6 11/28/2022 1050   MONOABS 0.8 11/28/2022 1050   EOSABS 0.0 11/28/2022 1050   BASOSABS 0.0 11/28/2022 1050    BMET    Component Value Date/Time   NA 141 11/28/2022 1050   K 3.8 11/28/2022 1050   CL 104 11/28/2022 1050   CO2 31 11/28/2022 1050   GLUCOSE 73 11/28/2022 1050   BUN 12 11/28/2022 1050   CREATININE 0.91 11/28/2022 1050   CALCIUM 9.8 11/28/2022 1050    BNP No results found for: "BNP"  ProBNP     Component Value Date/Time   PROBNP 41.0 11/28/2022 1050    Specialty Problems       Pulmonary Problems   Acute sinusitis   DOE (dyspnea on exertion)   Moderate persistent asthma   Restrictive lung disease    Allergies  Allergen Reactions   Codeine Other (See Comments) and Rash    Shakes and throws up   Other Other (See Comments)   Oxycodone-Acetaminophen Other (See Comments), Rash and Anaphylaxis    Gives her the shakes and she throws up   Amoxicillin Rash    Itching   Levofloxacin Other (See Comments)   Memantine Other (See Comments)    unknown    Immunization History  Administered Date(s) Administered   Influenza, High Dose Seasonal PF 12/20/2016, 12/07/2018, 02/04/2020, 12/01/2020   Influenza,inj,Quad PF,6+ Mos 01/05/2016   Influenza-Unspecified 12/20/2016   PNEUMOCOCCAL CONJUGATE-20 05/26/2022   PPD Test 09/13/2018, 09/13/2018, 09/03/2019, 09/03/2019, 04/18/2021, 04/18/2021   Unspecified SARS-COV-2 Vaccination 04/15/2019, 05/24/2019, 02/27/2020    Past Medical History:  Diagnosis Date   Allergic rhinitis    COPD (chronic obstructive pulmonary disease) (HCC)    Hyperlipidemia     Tobacco History: Social History   Tobacco Use  Smoking Status Former   Types: Cigarettes  Smokeless Tobacco Never   Counseling given: Not Answered   Continue to not smoke  Outpatient Encounter Medications as of 01/29/2023  Medication Sig   albuterol (VENTOLIN HFA) 108 (90 Base) MCG/ACT inhaler Inhale 1 puff into the lungs every 4 (four) hours as needed.   alendronate (FOSAMAX) 70 MG tablet Take 1 tablet by mouth once a week.   ARNUITY ELLIPTA 100 MCG/ACT AEPB Inhale 1 puff into the lungs daily.   atorvastatin (LIPITOR) 20 MG tablet Take 1 tablet by mouth daily.   azelastine (ASTELIN) 0.1 % nasal spray Place 2 sprays into both nostrils 2 (two) times daily. Use in each nostril as directed   citalopram (CELEXA) 20 MG tablet Take 20 mg by mouth daily.   donepezil (ARICEPT)  10 MG tablet Take 10 mg by mouth at bedtime.   doxycycline (VIBRA-TABS) 100 MG tablet Take 1 tablet (100 mg total) by mouth 2 (two) times daily.   fluticasone (FLONASE) 50 MCG/ACT nasal spray Place 1 spray into both nostrils daily.   Glycopyrrolate-Formoterol (BEVESPI AEROSPHERE) 9-4.8 MCG/ACT AERO Inhale 2 puffs by mouth twice daily   ipratropium-albuterol (DUONEB) 0.5-2.5 (3) MG/3ML SOLN  Take 3 mLs by nebulization every 4 (four) hours as needed.   montelukast (SINGULAIR) 10 MG tablet Take 1 tablet by mouth daily.   predniSONE (DELTASONE) 10 MG tablet 4 tabs for 2 days, then 3 tabs for 2 days, 2 tabs for 2 days, then 1 tab for 2 days, then stop   Facility-Administered Encounter Medications as of 01/29/2023  Medication   methylPREDNISolone acetate (DEPO-MEDROL) injection 80 mg     Review of Systems  Review of Systems  N/a Physical Exam  BP 110/60 (BP Location: Left Arm, Patient Position: Sitting, Cuff Size: Large)   Pulse 81   Temp 98.3 F (36.8 C) (Oral)   Ht 4\' 10"  (1.473 m)   Wt 156 lb 12.8 oz (71.1 kg)   SpO2 100%   BMI 32.77 kg/m   Wt Readings from Last 5 Encounters:  01/29/23 156 lb 12.8 oz (71.1 kg)  11/28/22 158 lb 12.8 oz (72 kg)  07/27/22 153 lb 3.2 oz (69.5 kg)  05/05/22 157 lb 9.6 oz (71.5 kg)    BMI Readings from Last 5 Encounters:  01/29/23 32.77 kg/m  11/28/22 34.36 kg/m  07/27/22 33.15 kg/m  05/05/22 32.94 kg/m     Physical Exam General: Sitting in chair, no acute distress Eyes: EOMI, no icterus Neck: Supple, no JVP Pulmonary: Clear, normal work of breathing Abdomen: Nondistended, sounds present MSK: No synovitis, no joint effusion Neuro: Normal gait, no weakness Psych: Normal mood, full affect   Assessment & Plan:   Dyspnea on exertion: Prior spirometry in 2023 without fixed obstruction but did suggest possibility of restriction, I cannot see results of lung volumes.  DLCO is reduced.  However, she has cross-sectional imaging shows no  evidence of ILD.  She reports normal stress test and normal echocardiogram via cardiology within the last year. She has significant kyphoscoliosis on prior imaging.  Suspect she has restriction due to extrathoracic causes, notably this.  Seems improved with DuoNebs.  Solu-Medrol IM today.  Complete 4 additional days of prednisone already prescribed.  Use DuoNebs.  Continue Bevespi and Arnuity.  Do wonder if vocal cord issues are at play here especially with her symptoms at rest.  It seems her symptoms do not quite fit with her exam given everything is clear, do wonder about pathologies outside of the lungs driving a lot of this.  Encouraged her to reengage with voice center at [facility].  Asthma: With exacerbation.  Seems worsens with weather changes.  Steroids, inhalers as above.  Seasonal allergies: Previously seen by allergy and immunology at [facility].  Encouraged her to reconvene with them.  Wonder if allergy shots or additional testing would be beneficial to her given seasonal variation and seasonal worsening of symptoms.  Elevated hemidiaphragm: reported on prior imaging.  Suspect related to cervical spine pathology, kyphoscoliosis.  Likely contributor dyspnea on exertion as well as restrictive lung disease.  Do not recommend any further evaluation.   Return in about 6 weeks (around 03/12/2023) for 4 to 6 weeks with APP or Dr. Judeth Horn whoever can see.   Karren Burly, MD 01/29/2023  I spent 45 minutes in the care of the patient including review of records, coordination of care, face-to-face visit.

## 2023-01-30 ENCOUNTER — Ambulatory Visit (HOSPITAL_BASED_OUTPATIENT_CLINIC_OR_DEPARTMENT_OTHER): Payer: Medicare Other

## 2023-01-30 ENCOUNTER — Telehealth: Payer: Self-pay | Admitting: Pulmonary Disease

## 2023-01-30 NOTE — Telephone Encounter (Signed)
Patient checking on RX for Prednisone 40 mg. Pharmacy is [facility]. Patient phone number is [phone].

## 2023-02-05 NOTE — Telephone Encounter (Signed)
Pt canceled echo on 11/12. Nfn

## 2023-02-06 MED ORDER — PREDNISONE 20 MG PO TABS: 40.0000 mg | ORAL_TABLET | Freq: Every day | ORAL | 0 refills | Status: DC

## 2023-02-06 NOTE — Telephone Encounter (Signed)
Nice to see you again   Solu-Medrol or steroid shot today   Start taking prednisone 40 mg a day tomorrow, for 4 total days   Continue all your inhalers and DuoNebs as prescribed   Dr. Maryfrances Bunnell is the ENT doctor who was working on the vocal cord issue   Dr. Melina Schools was the allergy doctor   I think it is worth contacting both of their offices and getting back in for further evaluation   Follow-up in 4 to 6 weeks     Rx sent patient notified [facility]

## 2023-02-13 ENCOUNTER — Ambulatory Visit: Payer: Medicare Other | Admitting: Pulmonary Disease

## 2023-03-09 ENCOUNTER — Telehealth: Payer: Self-pay | Admitting: Pulmonary Disease

## 2023-03-09 NOTE — Telephone Encounter (Signed)
PT is moving back to [place]. She has made a FU appt to see Dr. Judeth Horn but she needs a letter from him stating due to her condition she is not able to move herself. If we can call her when it is ready that would be great.   Something like this:  To Whom it May Concern:   Mrs. Savastano has been seen by our practice since ____ and has medical conditions that would make it hard/if not impossible for her to prepare for her move to [place].   We understand the [employer] has a program for fireman's widows that would assist her with that move at no charge. She has asked me to write a letter on her behalf with regard to this request. She has no family here so that would be of great benefit to her.  Thank you for your consideration,

## 2023-03-19 NOTE — Telephone Encounter (Signed)
43 days old. Please follow up.

## 2023-03-23 NOTE — Telephone Encounter (Signed)
 Letter was crafted and requested to be sent to patient.  Is attached to this encounter.  Please call patient and give her the option to let her pick up in the office if she would prefer.

## 2023-03-26 NOTE — Telephone Encounter (Signed)
 Contacted patient and made aware letter has been sent to her Mychart. Also gave option to pick up a copy. Patient wishes to pick up a copy therefore copy placed @ front desk. She will pick up on 03/29/23.

## 2023-03-30 ENCOUNTER — Other Ambulatory Visit: Payer: Self-pay | Admitting: Orthopaedic Surgery

## 2023-03-30 DIAGNOSIS — M48062 Spinal stenosis, lumbar region with neurogenic claudication: Secondary | ICD-10-CM

## 2023-03-30 DIAGNOSIS — M40209 Unspecified kyphosis, site unspecified: Secondary | ICD-10-CM

## 2023-03-30 DIAGNOSIS — M5412 Radiculopathy, cervical region: Secondary | ICD-10-CM

## 2023-04-19 ENCOUNTER — Ambulatory Visit
Admission: RE | Admit: 2023-04-19 | Discharge: 2023-04-19 | Disposition: A | Discharge status: Home / Self Care | Payer: Medicare Other | Source: Ambulatory Visit | Attending: Orthopaedic Surgery | Admitting: Orthopaedic Surgery

## 2023-04-19 DIAGNOSIS — M48062 Spinal stenosis, lumbar region with neurogenic claudication: Secondary | ICD-10-CM

## 2023-04-19 DIAGNOSIS — M40209 Unspecified kyphosis, site unspecified: Secondary | ICD-10-CM

## 2023-04-19 DIAGNOSIS — M5412 Radiculopathy, cervical region: Secondary | ICD-10-CM

## 2023-05-17 ENCOUNTER — Ambulatory Visit (INDEPENDENT_AMBULATORY_CARE_PROVIDER_SITE_OTHER): Payer: Medicare Other | Admitting: Pulmonary Disease

## 2023-05-17 ENCOUNTER — Encounter: Payer: Self-pay | Admitting: Pulmonary Disease

## 2023-05-17 VITALS — BP 128/72 | HR 67 | Temp 98.7°F | Ht <= 58 in | Wt 158.2 lb

## 2023-05-17 DIAGNOSIS — J984 Other disorders of lung: Secondary | ICD-10-CM

## 2023-05-17 DIAGNOSIS — R0609 Other forms of dyspnea: Secondary | ICD-10-CM

## 2023-05-17 NOTE — Patient Instructions (Signed)
 Lets see if we get to the best feet for many fractures assessment for the cough  Continue DuoNebs as needed  Overall oximetry to evaluate for possible nocturnal hypoxemia

## 2023-05-17 NOTE — Progress Notes (Signed)
 10  @Patient  ID: Hailey Odonnell, female    DOB: 09-09-1950, 73 y.o.   MRN: [id number]  Chief Complaint  Patient presents with   Follow-up    Shortness of breath and fatigue    Referring provider: Drenda Freeze  HPI:   72 y.o. woman who we are seeing for evaluation of dyspnea on exertion.  Most recent ED note reviewed.  Multiple ENT notes reviewed.  Allergy and immunology note reviewed.  Cardiology note reviewed.    Send biopsy multiple specialist given no clear lung issue.  Clearance kyphoscoliosis and extraparenchymal restriction but not a lung issue.  After evaluation seems like no one can find anything to help with.  She feels like Bevespi and albuterol help at times.  Prednisone did not help much.  Not much more we can offer.  Suspect a lot of his related to elevated hemidiaphragm kyphoscoliosis, things we cannot fix.  HPI at initial visit: She notes dyspnea on exertion for some time now.  She notes that it seems prior to COVID she was quite active.  Walking 5 miles a day.  She is much less active now.  As a result she reports weight gain, increased stomach size.  In addition she has known kyphoscoliosis.  She is suspicious that the combination of increased weight as well as her underlying kyphoscoliosis is the main driver of her symptoms.  She had pulmonary function test in 2023 [facility] showed normal ratio FEV1/FVC, possible mild restriction based on spirometry, I cannot see lung volumes, DLCO was 43% of predicted.  This prompted a CT scan of the chest to evaluate for ILD which on my review shows no evidence of ILD but low lung volumes and significant kyphoscoliosis.  She was been placed on inhalers.  Currently Arnuity.  Helped a little bit.  She is DuoNebs prior to working.  She finds this helps for a few hours.  Short-lived.  For ongoing workup she states she is been seen by cardiologist in [facility].  I cannot review any records or results of test.   But she is reports she had an echocardiogram and a stress test within the last year and told these results are reassuring.  Per pulmonary note she is being diagnosed with an elevated hemidiaphragm as well on imaging.    Questionaires / Pulmonary Flowsheets:   ACT:      No data to display          MMRC:     No data to display          Epworth:      No data to display          Tests:   FENO:  No results found for: "NITRICOXIDE"  PFT:     No data to display          WALK:      No data to display          Imaging: Personally reviewed via care everywhere MR LUMBAR SPINE WO CONTRAST Result Date: 04/25/2023 CLINICAL DATA:  Chronic scoliosis with progression. Bilateral lower extremity pain. EXAM: MRI LUMBAR SPINE WITHOUT CONTRAST TECHNIQUE: Multiplanar, multisequence MR imaging of the lumbar spine was performed. No intravenous contrast was administered. COMPARISON:  None Available. FINDINGS: Segmentation: 5 non rib-bearing lumbar type vertebral bodies are present. The lowest fully formed vertebral body is L5. Alignment: No significant listhesis is present. Exaggerated lumbar lordosis is present. Vertebrae:  Marrow signal and vertebral body  heights are normal. Conus medullaris and cauda equina: Conus extends to the L2 level. Conus and cauda equina appear normal. Paraspinal and other soft tissues: Limited imaging the abdomen is unremarkable. There is no significant adenopathy. No solid organ lesions are present. Disc levels: L1-2: Mild facet hypertrophy is present bilaterally. No significant disc protrusion or stenosis is present. L2-3: Insert normal disc L3-4: Insert normal disc L4-5: A rightward disc protrusion is present. Moderate facet hypertrophy is noted bilaterally. The central canal is patent. Mild right foraminal narrowing is present. L5-S1: A rightward disc protrusion is present. Mild facet hypertrophy is noted bilaterally. No focal stenosis is present.  IMPRESSION: 1. Rightward disc protrusion and moderate facet hypertrophy at L4-5 results in mild right foraminal narrowing. 2. Rightward disc protrusion and mild facet hypertrophy at L5-S1 without focal stenosis. 3. Mild facet hypertrophy bilaterally at L1-2 without significant disc protrusion or stenosis. Electronically Signed   By: Marin Roberts M.D.   On: 04/25/2023 20:53   MR THORACIC SPINE WO CONTRAST Result Date: 04/25/2023 CLINICAL DATA:  Progressive scoliosis. Pain in the upper and lower extremities bilaterally. EXAM: MRI THORACIC SPINE WITHOUT CONTRAST TECHNIQUE: Multiplanar, multisequence MR imaging of the thoracic spine was performed. No intravenous contrast was administered. COMPARISON:  None Available. FINDINGS: Alignment: No significant listhesis is present. Exaggerated thoracic kyphosis is centered from T5-T12. Vertebrae: Edematous endplate marrow changes are present at T8-9. Marrow signal and vertebral body heights are otherwise normal. Cord:  Normal signal and morphology. Paraspinal and other soft tissues: The visualized lung fields are clear. Visualized abdomen is within normal limits. Disc levels: Ankylosis of the thoracic spine is present from T3 through T8 and from T9-T12. Scratched at no focal disc protrusion or stenosis is present in the thoracic spine. The foramina are patent bilaterally. IMPRESSION: 1. 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 2. Exaggerated thoracic kyphosis centered from T5-T12. 3. No focal disc protrusion or stenosis in the thoracic spine. Electronically Signed   By: Marin Roberts M.D.   On: 04/25/2023 20:50   MR CERVICAL SPINE WO CONTRAST Result Date: 04/25/2023 CLINICAL DATA:  Lateral and leg pain. Chronic scoliosis with progression. EXAM: MRI CERVICAL SPINE WITHOUT CONTRAST TECHNIQUE: Multiplanar, multisequence MR imaging of the cervical spine was performed. No intravenous contrast was administered. COMPARISON:  Cervical spine  radiographs 02/07/2009 FINDINGS: Alignment: No significant listhesis is present. Mild exaggeration of the cervical lordosis is present. Mild leftward curvature is centered at C4-5. Vertebrae: Marrow signal and vertebral body heights are normal. Cord: Normal signal and morphology. Posterior Fossa, vertebral arteries, paraspinal tissues: Craniocervical junction is normal. Flow is present in the vertebral arteries bilaterally. Visualized intracranial contents are normal. Disc levels: C2-3: Asymmetric left-sided uncovertebral spurring is present without significant stenosis. C3-4: A broad-based disc osteophyte complex is present. Mild central and bilateral foraminal narrowing is worse right than left. C4-5: Broad-based disc osteophyte complex partially effaces the ventral CSF. Severe right and moderate left foraminal stenosis is present. C5-6: A broad-based disc osteophyte complex partially effaces the ventral CSF. Uncovertebral and facet hypertrophy contribute to moderate foraminal narrowing, right greater than left. C6-7: Uncovertebral spurring results in mild right foraminal narrowing. C7-T1: Negative. IMPRESSION: 1. Mild central and bilateral foraminal narrowing at C3-4 is worse right than left. 2. Severe right and moderate left foraminal stenosis at C4-5. 3. Moderate foraminal narrowing bilaterally at C5-6, right greater than left. 4. Mild right foraminal narrowing at C6-7. Electronically Signed   By: Virl Son.D.  On: 04/25/2023 20:42    Lab Results: Personally reviewed via care everywhere CBC    Component Value Date/Time   WBC 5.9 11/28/2022 1050   RBC 4.57 11/28/2022 1050   HGB 14.0 11/28/2022 1050   HCT 43.1 11/28/2022 1050   PLT 275.0 11/28/2022 1050   MCV 94.2 11/28/2022 1050   MCHC 32.6 11/28/2022 1050   RDW 14.2 11/28/2022 1050   LYMPHSABS 2.6 11/28/2022 1050   MONOABS 0.8 11/28/2022 1050   EOSABS 0.0 11/28/2022 1050   BASOSABS 0.0 11/28/2022 1050    BMET    Component  Value Date/Time   NA 141 11/28/2022 1050   K 3.8 11/28/2022 1050   CL 104 11/28/2022 1050   CO2 31 11/28/2022 1050   GLUCOSE 73 11/28/2022 1050   BUN 12 11/28/2022 1050   CREATININE 0.91 11/28/2022 1050   CALCIUM 9.8 11/28/2022 1050    BNP No results found for: "BNP"  ProBNP    Component Value Date/Time   PROBNP 41.0 11/28/2022 1050    Specialty Problems       Pulmonary Problems   Acute sinusitis   DOE (dyspnea on exertion)   Moderate persistent asthma   Restrictive lung disease    Allergies  Allergen Reactions   Codeine Other (See Comments) and Rash    Shakes and throws up   Other Other (See Comments)   Oxycodone-Acetaminophen Other (See Comments), Rash and Anaphylaxis    Gives her the shakes and she throws up   Amoxicillin Rash    Itching   Levofloxacin Other (See Comments)   Memantine Other (See Comments)    unknown    Immunization History  Administered Date(s) Administered   Influenza, High Dose Seasonal PF 12/20/2016, 12/07/2018, 02/04/2020, 12/01/2020   Influenza,inj,Quad PF,6+ Mos 01/05/2016   Influenza-Unspecified 12/20/2016   Moderna Covid-19 Fall Seasonal Vaccine 20yrs & older 04/12/2023   PNEUMOCOCCAL CONJUGATE-20 05/26/2022   PPD Test 09/13/2018, 09/13/2018, 09/03/2019, 09/03/2019, 04/18/2021, 04/18/2021   Unspecified SARS-COV-2 Vaccination 04/15/2019, 05/24/2019, 02/27/2020    Past Medical History:  Diagnosis Date   Allergic rhinitis    COPD (chronic obstructive pulmonary disease) (HCC)    Hyperlipidemia     Tobacco History: Social History   Tobacco Use  Smoking Status Former   Types: Cigarettes  Smokeless Tobacco Never   Counseling given: Not Answered   Continue to not smoke  Outpatient Encounter Medications as of 05/17/2023  Medication Sig   albuterol (VENTOLIN HFA) 108 (90 Base) MCG/ACT inhaler Inhale 1 puff into the lungs every 4 (four) hours as needed.   alendronate (FOSAMAX) 70 MG tablet Take 1 tablet by mouth once a  week.   ARNUITY ELLIPTA 100 MCG/ACT AEPB Inhale 1 puff into the lungs daily.   atorvastatin (LIPITOR) 20 MG tablet Take 1 tablet by mouth daily.   azelastine (ASTELIN) 0.1 % nasal spray Place 2 sprays into both nostrils 2 (two) times daily. Use in each nostril as directed   citalopram (CELEXA) 20 MG tablet Take 20 mg by mouth daily.   donepezil (ARICEPT) 10 MG tablet Take 10 mg by mouth at bedtime.   doxycycline (VIBRA-TABS) 100 MG tablet Take 1 tablet (100 mg total) by mouth 2 (two) times daily.   fluticasone (FLONASE) 50 MCG/ACT nasal spray Place 1 spray into both nostrils daily.   Glycopyrrolate-Formoterol (BEVESPI AEROSPHERE) 9-4.8 MCG/ACT AERO Inhale 2 puffs by mouth twice daily   ipratropium-albuterol (DUONEB) 0.5-2.5 (3) MG/3ML SOLN Take 3 mLs by nebulization every 4 (four) hours as needed.  montelukast (SINGULAIR) 10 MG tablet Take 1 tablet by mouth daily.   [DISCONTINUED] predniSONE (DELTASONE) 10 MG tablet 4 tabs for 2 days, then 3 tabs for 2 days, 2 tabs for 2 days, then 1 tab for 2 days, then stop (Patient not taking: Reported on 05/17/2023)   [DISCONTINUED] predniSONE (DELTASONE) 20 MG tablet Take 2 tablets (40 mg total) by mouth daily. (Patient not taking: Reported on 05/17/2023)   No facility-administered encounter medications on file as of 05/17/2023.     Review of Systems  Review of Systems  N/a Physical Exam  BP 128/72 (BP Location: Left Arm, Patient Position: Sitting, Cuff Size: Normal)   Pulse 67   Temp 98.7 F (37.1 C) (Oral)   Ht 4\' 10"  (1.473 m)   Wt 158 lb 3.2 oz (71.8 kg)   SpO2 99%   BMI 33.06 kg/m   Wt Readings from Last 5 Encounters:  05/17/23 158 lb 3.2 oz (71.8 kg)  01/29/23 156 lb 12.8 oz (71.1 kg)  11/28/22 158 lb 12.8 oz (72 kg)  07/27/22 153 lb 3.2 oz (69.5 kg)  05/05/22 157 lb 9.6 oz (71.5 kg)    BMI Readings from Last 5 Encounters:  05/17/23 33.06 kg/m  01/29/23 32.77 kg/m  11/28/22 34.36 kg/m  07/27/22 33.15 kg/m  05/05/22 32.94  kg/m     Physical Exam General: Sitting in chair, no acute distress Eyes: EOMI, no icterus Neck: Supple, no JVP Pulmonary: Clear, normal work of breathing Abdomen: Nondistended, sounds present MSK: No synovitis, no joint effusion Neuro: Normal gait, no weakness Psych: Normal mood, full affect   Assessment & Plan:   Dyspnea on exertion due to restrictive lung disease: Prior spirometry in 2023 without fixed obstruction but did suggest possibility of restriction, I cannot see results of lung volumes.  DLCO is reduced.  However, she has cross-sectional imaging shows no evidence of ILD.  She reports normal stress test.  TTE 01/2023 demonstrates right atrial enlargement and right ventricular enlargement despite reportedly normal from cardiologist.  She has significant kyphoscoliosis on prior imaging.  Suspect she has restriction due to extrathoracic causes, notably this.  Seems improved with DuoNebs,, Bevespi.  No clear improvement with steroid therapy.  Continue DuoNebs, Bevespi, will need manufacturing assistance.  Do wonder if vocal cord issues are at play here especially with her symptoms at rest.  And her abnormal breathing pattern.  Very suspicious.  It seems her symptoms do not quite fit with her exam given everything is clear, do wonder about pathologies outside of the lungs driving a lot of this.  Encouraged ongoing voice training, ENT follow-up.  Encouraged her to discuss at upcoming visit with neurologist.  Open oximetry on room air to assess for possible nocturnal hypoxemia.  Seasonal allergies: Previously seen by allergy and immunology at [facility].  Despite her seasonal variation in symptoms allergist says "she does not have allergies".  Elevated hemidiaphragm: reported on prior imaging.  Suspect related to cervical spine pathology, kyphoscoliosis.  Likely contributor dyspnea on exertion as well as restrictive lung disease.  Do not recommend any further evaluation.   Return in about 3  months (around 08/14/2023) for f/u Dr. Judeth Horn.   Karren Burly, MD 05/17/2023

## 2023-06-05 ENCOUNTER — Telehealth: Payer: Self-pay | Admitting: Pulmonary Disease

## 2023-06-05 NOTE — Telephone Encounter (Signed)
 Overnight oximetry reviewed.  On room air.  No oxygen desaturation.  No need for oxygen.  Please contact patient with results, MyChart not set up.  Thank you.

## 2023-06-05 NOTE — Telephone Encounter (Signed)
 Spoke to patient and relayed below results. She voiced her understanding.  She wanted to make Dr. Judeth Horn aware that she was be moving to [facility] 4/12. Nothing further needed at this time.

## 2023-06-12 ENCOUNTER — Encounter: Payer: Self-pay | Admitting: Pulmonary Disease
